# Patient Record
Sex: Male | Born: 1945 | Race: White | Hispanic: No | Marital: Married | Smoking: Never smoker
Health system: Southern US, Community
[De-identification: ages and names within clinical notes are randomized; demographics above are authoritative.]

## PROBLEM LIST (undated history)

## (undated) DIAGNOSIS — I10 Essential (primary) hypertension: Secondary | ICD-10-CM

## (undated) DIAGNOSIS — E785 Hyperlipidemia, unspecified: Secondary | ICD-10-CM

## (undated) HISTORY — PX: HERNIA REPAIR: SHX51

---

## 2005-11-12 ENCOUNTER — Other Ambulatory Visit: Payer: Self-pay

## 2005-11-12 ENCOUNTER — Emergency Department: Payer: Self-pay | Admitting: Emergency Medicine

## 2007-10-04 IMAGING — CT CT HEAD WITHOUT CONTRAST
2 series · 15 of 30 positions shown, 19 images · non-contrast
Comparison: none

REASON FOR EXAM: slurred speech/[HOSPITAL]
COMMENTS:

[Series 2: without · axial · non-contrast · 0.48mm/px · z∈[-276,-156]mm · 13 of 30 slices shown, 17 images]
[im 3/30  brain]
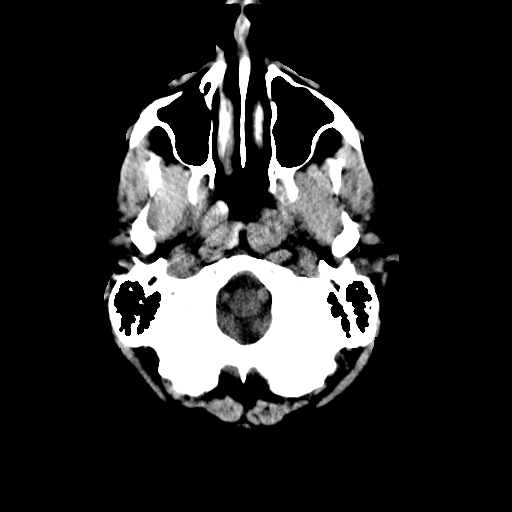
[im 3/30  bone]
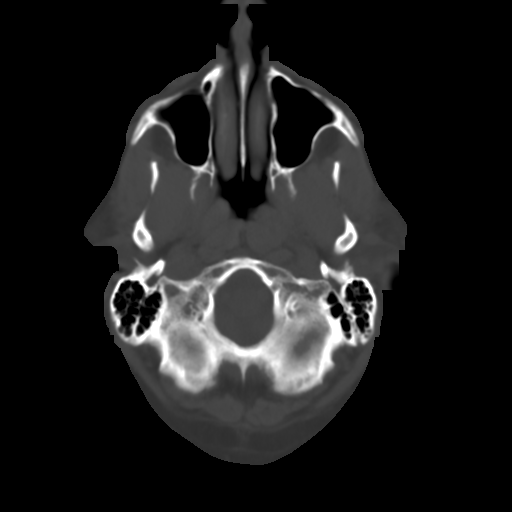
[im 5/30  brain]
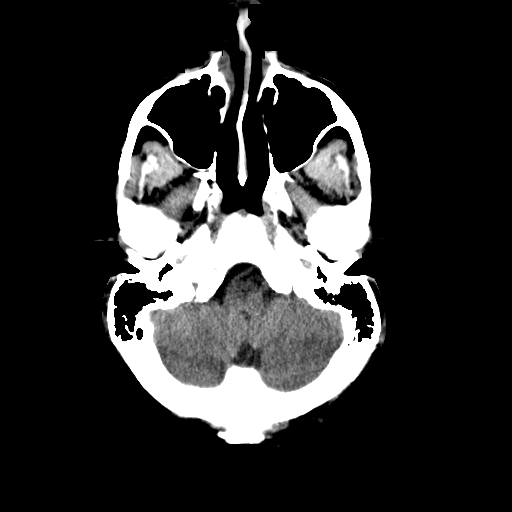
[im 7/30  brain]
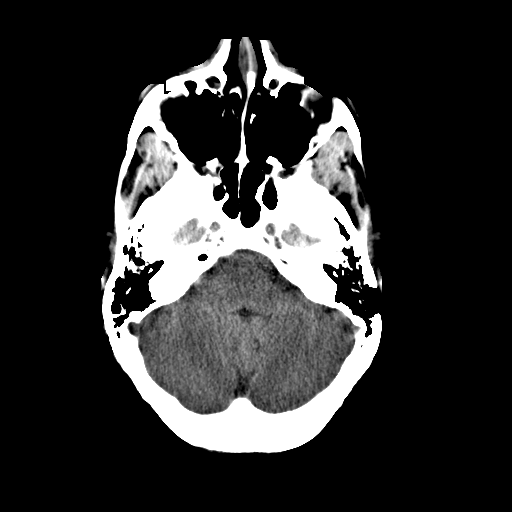
[im 9/30  brain]
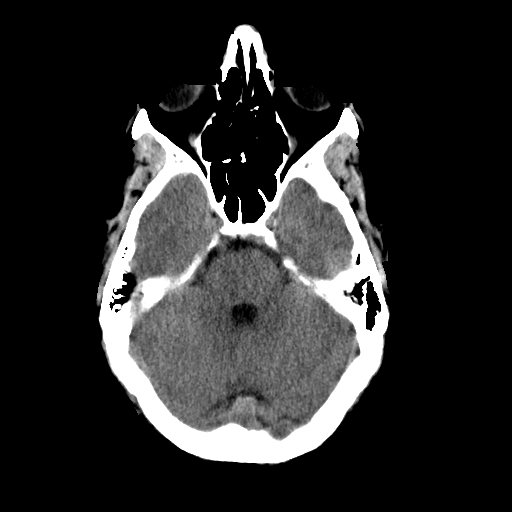
[im 11/30  brain]
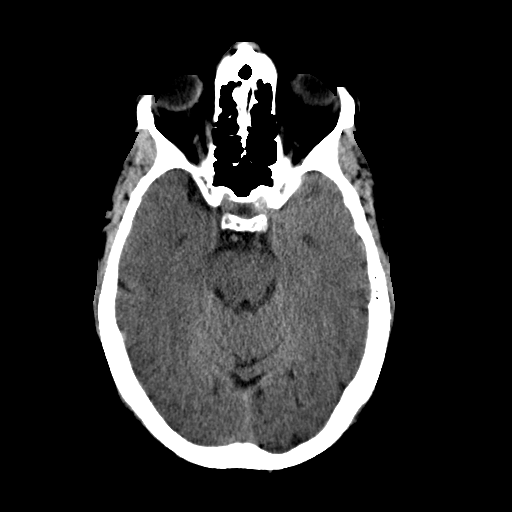
[im 11/30  bone]
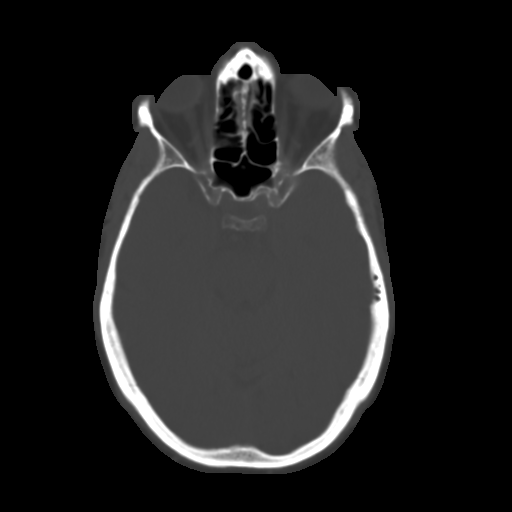
[im 13/30  brain]
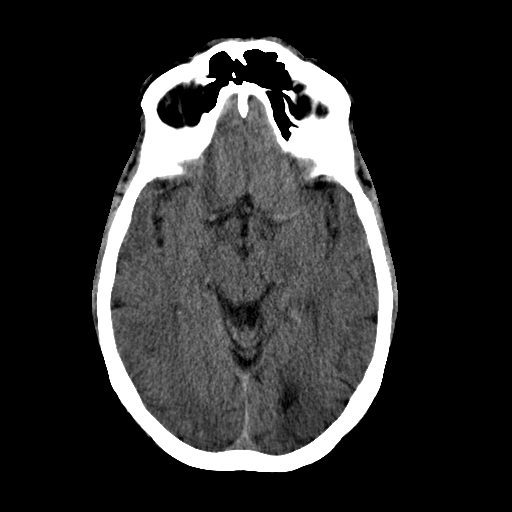
[im 15/30  brain]
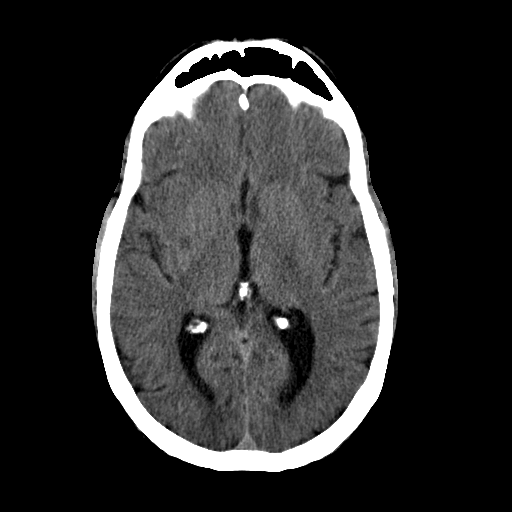
[im 17/30  brain]
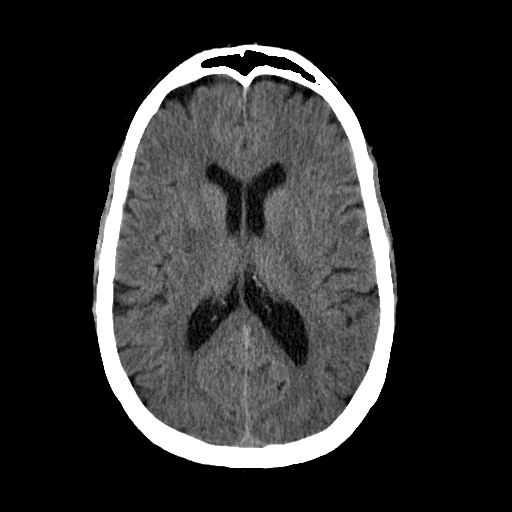
[im 19/30  brain]
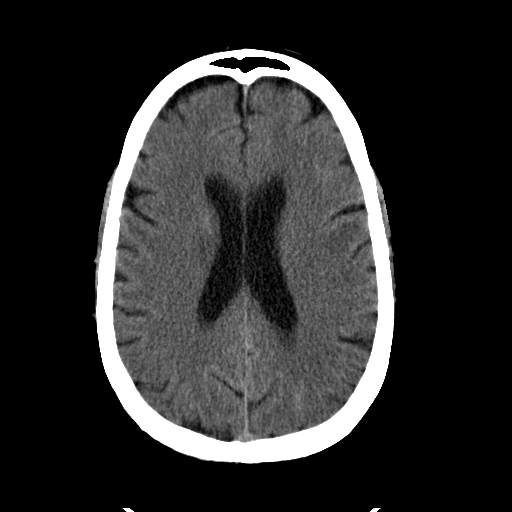
[im 19/30  bone]
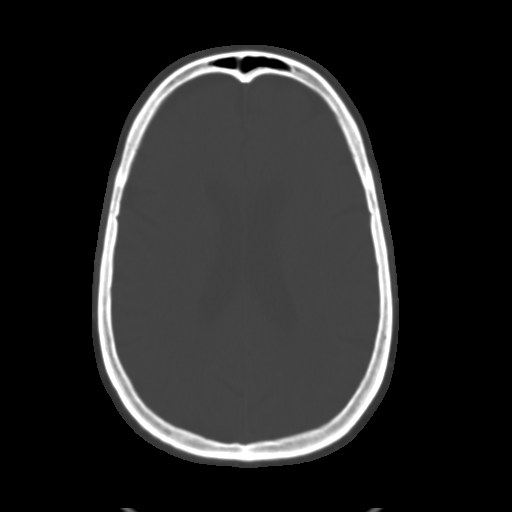
[im 21/30  brain]
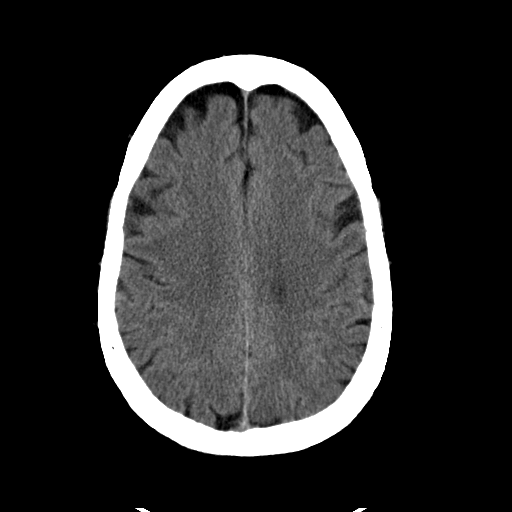
[im 23/30  brain]
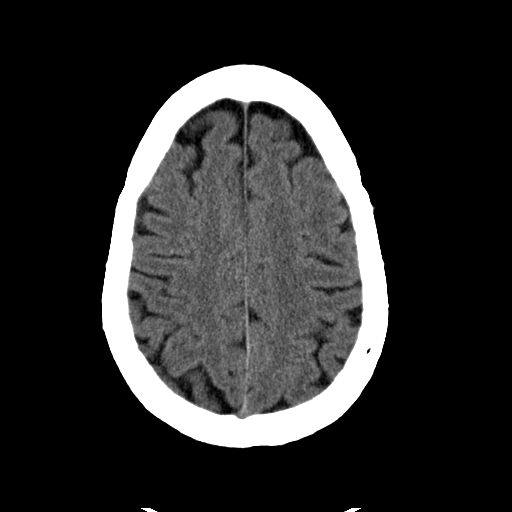
[im 25/30  brain]
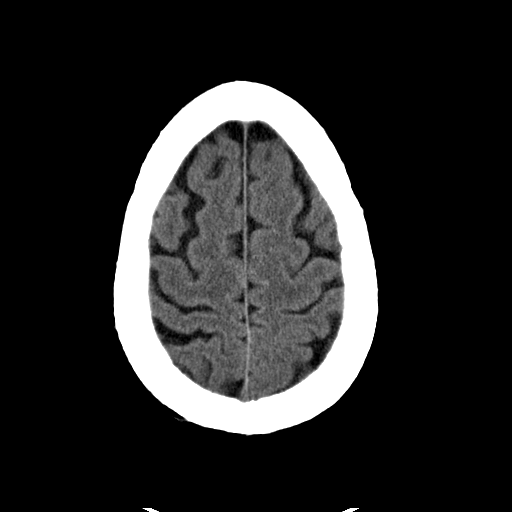
[im 27/30  brain]
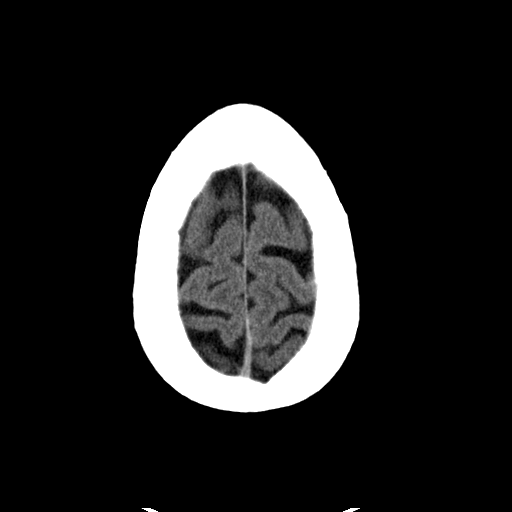
[im 27/30  bone]
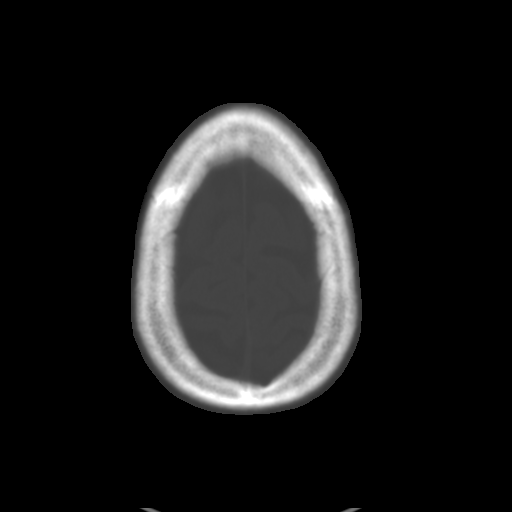

[Series 3: bone · axial · 0.48mm/px · z∈[-276,-256]mm · 2 of 30 slices shown]
[im 3/30  bone]
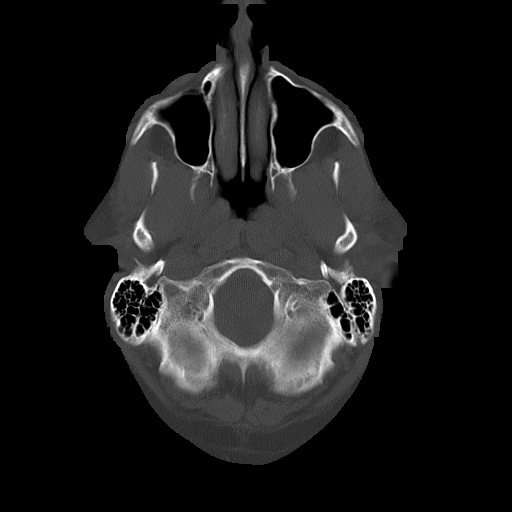
[im 7/30  bone]
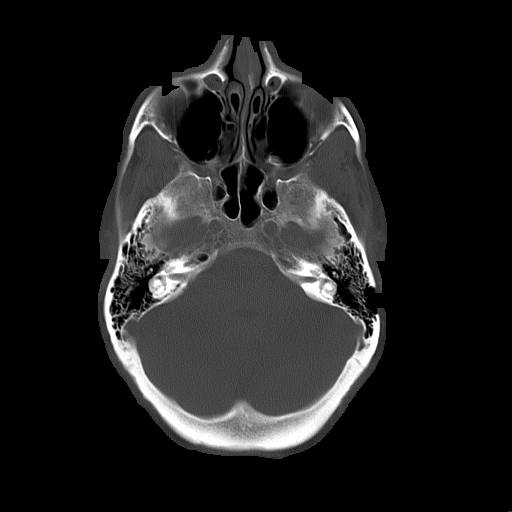

[15 of 30 positions shown; findings below may reference images not displayed]

PROCEDURE:     CT  - CT HEAD WITHOUT CONTRAST  - November 12, 2005  [DATE]

RESULT:     The patient reports several hours of LEFT facial droop.

The ventricles are normal in size and position. There is hypodensity within
the basal ganglia on the RIGHT bilaterally.  This is in the region of the
globus pallidus. This may reflect early ischemic change.  Elsewhere within
the brain, I do not see abnormal hypodensity. There is no evidence of an
intracranial hemorrhage.  The cerebellum is normal in appearance. At bone
window settings I do not see evidence of lytic or blastic lesions, evidence
of a skull fracture, nor evidence of air fluid levels in the paranasal
sinuses.
IMPRESSION: 1)There are findings that may reflect early ischemia involving the basal
ganglia on the RIGHT.  This may be related to the patient's current
symptoms.  Follow-up MRI would be of value.

The findings were called to Dr. Frey in the [HOSPITAL]
the conclusion of the study.

## 2015-04-05 ENCOUNTER — Ambulatory Visit
Admission: EM | Admit: 2015-04-05 | Discharge: 2015-04-05 | Disposition: A | Discharge status: Home / Self Care | Payer: Medicare Other | Attending: Family Medicine | Admitting: Family Medicine

## 2015-04-05 DIAGNOSIS — K047 Periapical abscess without sinus: Secondary | ICD-10-CM

## 2015-04-05 HISTORY — DX: Essential (primary) hypertension: I10

## 2015-04-05 HISTORY — DX: Hyperlipidemia, unspecified: E78.5

## 2015-04-05 MED ORDER — HYDROCODONE-ACETAMINOPHEN 5-325 MG PO TABS: 1.0000 | ORAL_TABLET | Freq: Four times a day (QID) | ORAL | Status: AC | PRN

## 2015-04-05 MED ORDER — ACETAMINOPHEN 500 MG PO TABS
500.0000 mg | ORAL_TABLET | Freq: Four times a day (QID) | ORAL | Status: AC
Start: 2015-04-05 — End: ?

## 2015-04-05 MED ORDER — AMOXICILLIN-POT CLAVULANATE 875-125 MG PO TABS
1.0000 | ORAL_TABLET | Freq: Two times a day (BID) | ORAL | Status: AC
Start: 2015-04-05 — End: ?

## 2015-04-05 NOTE — Discharge Instructions (Signed)
Dental Abscess °A dental abscess is a collection of infected fluid (pus) from a bacterial infection in the inner part of the tooth (pulp). It usually occurs at the end of the tooth's root.  °CAUSES  °· Severe tooth decay. °· Trauma to the tooth that allows bacteria to enter into the pulp, such as a broken or chipped tooth. °SYMPTOMS  °· Severe pain in and around the infected tooth. °· Swelling and redness around the abscessed tooth or in the mouth or face. °· Tenderness. °· Pus drainage. °· Bad breath. °· Bitter taste in the mouth. °· Difficulty swallowing. °· Difficulty opening the mouth. °· Nausea. °· Vomiting. °· Chills. °· Swollen neck glands. °DIAGNOSIS  °· A medical and dental history will be taken. °· An examination will be performed by tapping on the abscessed tooth. °· X-rays may be taken of the tooth to identify the abscess. °TREATMENT °The goal of treatment is to eliminate the infection. You may be prescribed antibiotic medicine to stop the infection from spreading. A root canal may be performed to save the tooth. If the tooth cannot be saved, it may be pulled (extracted) and the abscess may be drained.  °HOME CARE INSTRUCTIONS °· Only take over-the-counter or prescription medicines for pain, fever, or discomfort as directed by your caregiver. °· Rinse your mouth (gargle) often with salt water (¼ tsp salt in 8 oz [250 ml] of warm water) to relieve pain or swelling. °· Do not drive after taking pain medicine (narcotics). °· Do not apply heat to the outside of your face. °· Return to your dentist for further treatment as directed. °SEEK MEDICAL CARE IF: °· Your pain is not helped by medicine. °· Your pain is getting worse instead of better. °SEEK IMMEDIATE MEDICAL CARE IF: °· You have a fever or persistent symptoms for more than 2-3 days. °· You have a fever and your symptoms suddenly get worse. °· You have chills or a very bad headache. °· You have problems breathing or swallowing. °· You have trouble  opening your mouth. °· You have swelling in the neck or around the eye. °Document Released: 07/10/2005 Document Revised: 04/03/2012 Document Reviewed: 10/18/2010 °[employer]® Patient Information ©2015 [employer]. This information is not intended to replace advice given to you by your health care provider. Make sure you discuss any questions you have with your health care provider. ° °Dental Care and Dentist Visits °Dental care supports good overall health. Regular dental visits can also help you avoid dental pain, bleeding, infection, and other more serious health problems in the future. It is important to keep the mouth healthy because diseases in the teeth, gums, and other oral tissues can spread to other areas of the body. Some problems, such as diabetes, heart disease, and pre-term labor have been associated with poor oral health.  °See your dentist every 6 months. If you experience emergency problems such as a toothache or broken tooth, go to the dentist right away. If you see your dentist regularly, you may catch problems early. It is easier to be treated for problems in the early stages.  °WHAT TO EXPECT AT A DENTIST VISIT  °Your dentist will look for many common oral health problems and recommend proper treatment. At your regular dental visit, you can expect: °· Gentle cleaning of the teeth and gums. This includes scraping and polishing. This helps to remove the sticky substance around the teeth and gums (plaque). Plaque forms in the mouth shortly after eating. Over time, plaque hardens   on the teeth as tartar. If tartar is not removed regularly, it can cause problems. Cleaning also helps remove stains. °· Periodic X-rays. These pictures of the teeth and supporting bone will help your dentist assess the health of your teeth. °· Periodic fluoride treatments. Fluoride is a natural mineral shown to help strengthen teeth. Fluoride treatment involves applying a fluoride gel or varnish to the teeth. It is most  commonly done in children. °· Examination of the mouth, tongue, jaws, teeth, and gums to look for any oral health problems, such as: °¨ Cavities (dental caries). This is decay on the tooth caused by plaque, sugar, and acid in the mouth. It is best to catch a cavity when it is small. °¨ Inflammation of the gums caused by plaque buildup (gingivitis). °¨ Problems with the mouth or malformed or misaligned teeth. °¨ Oral cancer or other diseases of the soft tissues or jaws.  °KEEP YOUR TEETH AND GUMS HEALTHY °For healthy teeth and gums, follow these general guidelines as well as your dentist's specific advice: °· Have your teeth professionally cleaned at the dentist every 6 months. °· Brush twice daily with a fluoride toothpaste. °· Floss your teeth daily.  °· Ask your dentist if you need fluoride supplements, treatments, or fluoride toothpaste. °· Eat a healthy diet. Reduce foods and drinks with added sugar. °· Avoid smoking. °TREATMENT FOR ORAL HEALTH PROBLEMS °If you have oral health problems, treatment varies depending on the conditions present in your teeth and gums. °· Your caregiver will most likely recommend good oral hygiene at each visit. °· For cavities, gingivitis, or other oral health disease, your caregiver will perform a procedure to treat the problem. This is typically done at a separate appointment. Sometimes your caregiver will refer you to another dental specialist for specific tooth problems or for surgery. °SEEK IMMEDIATE DENTAL CARE IF: °· You have pain, bleeding, or soreness in the gum, tooth, jaw, or mouth area. °· A permanent tooth becomes loose or separated from the gum socket. °· You experience a blow or injury to the mouth or jaw area. °Document Released: 03/22/2011 Document Revised: 10/02/2011 Document Reviewed: 03/22/2011 °Exit[employer]® Patient Information ©2015 [employer]. This information is not intended to replace advice given to you by your health care provider. Make sure you discuss any  questions you have with your health care provider. ° °

## 2015-04-05 NOTE — ED Provider Notes (Signed)
CSN: [id number]     Arrival date & time 04/05/15  1013 History   First MD Initiated Contact with Patient 04/05/15 1037     Chief Complaint  Patient presents with  . Dental Pain   (Consider location/radiation/quality/duration/timing/severity/associated sxs/prior Treatment) HPI Comments: Married caucasian male retired here for evaluation of swelling right lower jaw since Friday 02 Apr 2015.  Has tried hot pack, tylenol/motrin without relief, unable to sleep.  Wife RN works in Infectious Disease  Brushes teeth daily.  Has not seen dentist in 5 years.  Reported some cracked fillings/teeth that he has not had repaired or recent teeth cleaning.  Patient is a 69 y.o. male presenting with tooth pain. The history is provided by the patient.  Dental Pain Location:  Lower Lower teeth location:  27/RL cuspid, 30/RL 1st molar, 31/RL 2nd molar, 29/RL 2nd bicuspid and 28/RL 1st bicuspid Quality:  Aching, pressure-like, pulsating and constant Severity:  Severe Onset quality:  Gradual Duration:  4 days Timing:  Constant Progression:  Worsening Chronicity:  New Context: abscess, crown fracture, dental caries, dental fracture and poor dentition   Context: cap still on, filling still in place, not intrusion, not malocclusion, not recent dental surgery and not trauma   Relieved by:  Nothing Worsened by:  Jaw movement, pressure and touching Ineffective treatments:  Heat, rest, NSAIDs and acetaminophen Associated symptoms: facial pain, facial swelling, gum swelling and headaches   Associated symptoms: no congestion, no difficulty swallowing, no drooling, no fever, no neck pain, no neck swelling, no oral bleeding, no oral lesions and no trismus   Headaches:    Severity:  Mild   Onset quality:  Gradual   Duration:  2 days   Timing:  Constant   Progression:  Worsening   Chronicity:  New Risk factors: lack of dental care and periodontal disease   Risk factors: no alcohol problem, no cancer, no chewing  tobacco use, no diabetes, no immunosuppression and no smoking     Past Medical History  Diagnosis Date  . Hypertension   . Hyperlipemia    Past Surgical History  Procedure Laterality Date  . Hernia repair     No family history on file. Social History  Substance Use Topics  . Smoking status: Never Smoker   . Smokeless tobacco: None  . Alcohol Use: No    Review of Systems  Constitutional: Negative for fever, chills, diaphoresis, activity change, appetite change and fatigue.  HENT: Positive for dental problem and facial swelling. Negative for congestion, drooling, ear discharge, ear pain, hearing loss, mouth sores, nosebleeds, postnasal drip, rhinorrhea, sinus pressure, sneezing, sore throat, tinnitus, trouble swallowing and voice change.   Eyes: Negative for photophobia, pain, discharge, redness, itching and visual disturbance.  Respiratory: Negative for cough, choking, chest tightness, shortness of breath, wheezing and stridor.   Cardiovascular: Negative for chest pain, palpitations and leg swelling.  Gastrointestinal: Negative for nausea, vomiting, abdominal pain, diarrhea, constipation, blood in stool and abdominal distention.  Endocrine: Negative for cold intolerance and heat intolerance.  Genitourinary: Negative for dysuria.  Musculoskeletal: Positive for myalgias. Negative for back pain, joint swelling, arthralgias, gait problem, neck pain and neck stiffness.  Skin: Negative for color change, pallor, rash and wound.  Allergic/Immunologic: Negative for environmental allergies and food allergies.  Neurological: Positive for headaches. Negative for dizziness, tremors, seizures, syncope, facial asymmetry, speech difficulty, weakness, light-headedness and numbness.  Hematological: Negative for adenopathy. Does not bruise/bleed easily.  Psychiatric/Behavioral: Positive for sleep disturbance. Negative for behavioral problems, confusion and  agitation.    Allergies  Review of  patient's allergies indicates no known allergies.  Home Medications   Prior to Admission medications   Medication Sig Start Date End Date Taking? Authorizing Provider  aspirin 81 MG tablet Take 81 mg by mouth daily.   Yes Historical Provider, MD  chlorthalidone (HYGROTEN) 100 MG tablet Take 100 mg by mouth daily.   Yes Historical Provider, MD  glucosamine-chondroitin 500-400 MG tablet Take 1 tablet by mouth 3 (three) times daily.   Yes Historical Provider, MD  lisinopril (PRINIVIL,ZESTRIL) 10 MG tablet Take 10 mg by mouth daily.   Yes Historical Provider, MD  Multiple Vitamin (MULTIVITAMIN) capsule Take 1 capsule by mouth daily.   Yes Historical Provider, MD  PARoxetine (PAXIL) 10 MG tablet Take 10 mg by mouth daily.   Yes Historical Provider, MD  simvastatin (ZOCOR) 10 MG tablet Take 10 mg by mouth daily.   Yes Historical Provider, MD  acetaminophen (TYLENOL) 500 MG tablet Take 1 tablet (500 mg total) by mouth every 6 (six) hours. 04/05/15   Barbaraann Barthel, NP  amoxicillin-clavulanate (AUGMENTIN) 875-125 MG per tablet Take 1 tablet by mouth every 12 (twelve) hours. 04/05/15   Barbaraann Barthel, NP  HYDROcodone-acetaminophen (NORCO/VICODIN) 5-325 MG per tablet Take 1 tablet by mouth every 6 (six) hours as needed for moderate pain or severe pain. 04/05/15   Barbaraann Barthel, NP   Meds Ordered and Administered this Visit  Medications - No data to display  BP 145/83 mmHg  Pulse 74  Temp(Src) 98.4 F (36.9 C) (Oral)  Resp 16  Ht 5\' 5"  (1.651 m)  Wt 185 lb (83.915 kg)  BMI 30.79 kg/m2  SpO2 100% No data found.   Physical Exam  Constitutional: He is oriented to person, place, and time. Vital signs are normal. He appears well-developed and well-nourished. No distress.  HENT:  Head: Normocephalic and atraumatic.    Right Ear: Hearing, tympanic membrane, external ear and ear canal normal. No lacerations. No drainage, swelling or tenderness. No mastoid tenderness. Tympanic membrane is  not injected. No middle ear effusion. No hemotympanum. No decreased hearing is noted.  Left Ear: Hearing, tympanic membrane, external ear and ear canal normal.  No middle ear effusion.  Nose: Nose normal. No mucosal edema, rhinorrhea, nose lacerations, sinus tenderness, nasal deformity, septal deviation or nasal septal hematoma. No epistaxis.  No foreign bodies. Right sinus exhibits no maxillary sinus tenderness and no frontal sinus tenderness. Left sinus exhibits no maxillary sinus tenderness and no frontal sinus tenderness.  Mouth/Throat: Uvula is midline, oropharynx is clear and moist and mucous membranes are normal. Mucous membranes are not pale, not dry and not cyanotic. He does not have dentures. No oral lesions. No trismus in the jaw. Abnormal dentition. Dental abscesses and dental caries present. No uvula swelling or lacerations. No oropharyngeal exudate, posterior oropharyngeal edema, posterior oropharyngeal erythema or tonsillar abscesses.  Eyes: Conjunctivae, EOM and lids are normal. Pupils are equal, round, and reactive to light. Right eye exhibits no discharge. Left eye exhibits no discharge. No scleral icterus.  Neck: Trachea normal and normal range of motion. Neck supple. Normal carotid pulses present. No tracheal tenderness, no spinous process tenderness and no muscular tenderness present. Carotid bruit is not present. No rigidity. No tracheal deviation, no edema, no erythema and normal range of motion present. No thyroid mass and no thyromegaly present.  Cardiovascular: Normal rate, regular rhythm, normal heart sounds and intact distal pulses.  Exam reveals no gallop and no  friction rub.   No murmur heard. Pulmonary/Chest: Effort normal and breath sounds normal. No stridor. No respiratory distress. He has no wheezes. He has no rales.  Abdominal: Soft. He exhibits no distension.  Musculoskeletal: Normal range of motion. He exhibits tenderness. He exhibits no edema.       Right shoulder:  Normal.       Left shoulder: Normal.       Cervical back: Normal.  Lymphadenopathy:    He has no cervical adenopathy.  Neurological: He is alert and oriented to person, place, and time. No cranial nerve deficit. He exhibits normal muscle tone. Coordination normal.  Skin: Skin is warm, dry and intact. No abrasion, no bruising, no burn, no ecchymosis, no laceration, no lesion, no petechiae and no rash noted. He is not diaphoretic. No cyanosis or erythema. No pallor. Nails show no clubbing.  Psychiatric: He has a normal mood and affect. His speech is normal and behavior is normal. Judgment and thought content normal. Cognition and memory are normal.  Nursing note and vitals reviewed.   ED Course  Procedures (including critical care time)  Labs Review Labs Reviewed - No data to display  Imaging Review No results found.  Reviewed [place] Controlled substances website and patient has not received any controlled substances Mar 2016 to present.   MDM   1. Dental abscess    Take augmentin  po BID x 10 days as ordered. Tylenol  po QID.  Vicodin 5/325mg  po q6h prn.  Discussed no alcohol or driving if taking vicodin as can cause drowsiness.  Schedule follow up with dentist of his choice for re-evaluation and routine dental care as overdue.  Continue listerine gargle and spit TID.  Discussed motrin/naproxen could elevate blood pressure.  Salt water gargles po prn.  Good dental hygiene brushing, flossing, mouthwashTID.  ER if ludwig's angina, dyspnea, dysphagia, drooling.  Symptoms should be improving over the next 48 hours.  If notice pustule inside mouth contact dentist for I&D or follow up with ENT if pustule outside of face for I&D.  Follow up with PCM/dentist if no improvement or worsening of symptoms e.g. fever, chills, dyspnea, SOB, purulent discharge, neck pain, eye pain.   Discussed with patient dental caries/peridontal disease can worsen heart disease.   Patient verbalized understanding of  information/instructions agreed with plan of care and had no further questions at this time.    Barbaraann Barthel, NP 04/06/15 [phone]

## 2015-04-05 NOTE — ED Notes (Signed)
Pt states "My right lower jaw started to swell Friday, painful, unable to sleep."
